# Patient Record
Sex: Male | Born: 1960
Health system: Southern US, Community
[De-identification: ages and names within clinical notes are randomized; demographics above are authoritative.]

## PROBLEM LIST (undated history)

## (undated) DIAGNOSIS — K219 Gastro-esophageal reflux disease without esophagitis: Secondary | ICD-10-CM

## (undated) DIAGNOSIS — T7840XA Allergy, unspecified, initial encounter: Secondary | ICD-10-CM

## (undated) DIAGNOSIS — E079 Disorder of thyroid, unspecified: Secondary | ICD-10-CM

## (undated) DIAGNOSIS — E291 Testicular hypofunction: Secondary | ICD-10-CM

## (undated) DIAGNOSIS — R0989 Other specified symptoms and signs involving the circulatory and respiratory systems: Secondary | ICD-10-CM

## (undated) DIAGNOSIS — E559 Vitamin D deficiency, unspecified: Secondary | ICD-10-CM

## (undated) DIAGNOSIS — K589 Irritable bowel syndrome without diarrhea: Secondary | ICD-10-CM

## (undated) HISTORY — DX: Gastro-esophageal reflux disease without esophagitis: K21.9

## (undated) HISTORY — DX: Other specified symptoms and signs involving the circulatory and respiratory systems: R09.89

## (undated) HISTORY — PX: NO PAST SURGERIES: SHX2092

## (undated) HISTORY — DX: Irritable bowel syndrome, unspecified: K58.9

## (undated) HISTORY — DX: Disorder of thyroid, unspecified: E07.9

## (undated) HISTORY — DX: Vitamin D deficiency, unspecified: E55.9

## (undated) HISTORY — DX: Testicular hypofunction: E29.1

## (undated) HISTORY — DX: Allergy, unspecified, initial encounter: T78.40XA

---

## 1998-10-04 ENCOUNTER — Emergency Department (HOSPITAL_COMMUNITY): Admission: EM | Admit: 1998-10-04 | Discharge: 1998-10-04 | Payer: Self-pay | Admitting: Emergency Medicine

## 1998-10-04 ENCOUNTER — Encounter: Payer: Self-pay | Admitting: Emergency Medicine

## 1999-06-15 ENCOUNTER — Emergency Department (HOSPITAL_COMMUNITY): Admission: EM | Admit: 1999-06-15 | Discharge: 1999-06-16 | Payer: Self-pay | Admitting: Emergency Medicine

## 1999-06-16 ENCOUNTER — Emergency Department (HOSPITAL_COMMUNITY): Admission: EM | Admit: 1999-06-16 | Discharge: 1999-06-16 | Payer: Self-pay | Admitting: Family Medicine

## 1999-06-16 ENCOUNTER — Encounter: Payer: Self-pay | Admitting: Family Medicine

## 1999-07-05 ENCOUNTER — Ambulatory Visit (HOSPITAL_COMMUNITY): Admission: RE | Admit: 1999-07-05 | Discharge: 1999-07-05 | Payer: Self-pay | Admitting: *Deleted

## 2002-02-07 ENCOUNTER — Encounter: Payer: Self-pay | Admitting: Internal Medicine

## 2002-02-07 ENCOUNTER — Ambulatory Visit (HOSPITAL_COMMUNITY): Admission: RE | Admit: 2002-02-07 | Discharge: 2002-02-07 | Payer: Self-pay | Admitting: Internal Medicine

## 2006-05-28 ENCOUNTER — Ambulatory Visit (HOSPITAL_COMMUNITY): Admission: RE | Admit: 2006-05-28 | Discharge: 2006-05-29 | Payer: Self-pay | Admitting: *Deleted

## 2006-07-21 ENCOUNTER — Encounter (INDEPENDENT_AMBULATORY_CARE_PROVIDER_SITE_OTHER): Payer: Self-pay | Admitting: Specialist

## 2006-07-21 ENCOUNTER — Ambulatory Visit (HOSPITAL_BASED_OUTPATIENT_CLINIC_OR_DEPARTMENT_OTHER): Admission: RE | Admit: 2006-07-21 | Discharge: 2006-07-21 | Payer: Self-pay | Admitting: *Deleted
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
## 2013-10-30 DIAGNOSIS — R0989 Other specified symptoms and signs involving the circulatory and respiratory systems: Secondary | ICD-10-CM | POA: Insufficient documentation

## 2013-10-30 DIAGNOSIS — T7840XA Allergy, unspecified, initial encounter: Secondary | ICD-10-CM | POA: Insufficient documentation

## 2013-10-30 DIAGNOSIS — E079 Disorder of thyroid, unspecified: Secondary | ICD-10-CM | POA: Insufficient documentation

## 2013-10-30 DIAGNOSIS — K219 Gastro-esophageal reflux disease without esophagitis: Secondary | ICD-10-CM | POA: Insufficient documentation

## 2013-10-30 DIAGNOSIS — K589 Irritable bowel syndrome without diarrhea: Secondary | ICD-10-CM | POA: Insufficient documentation

## 2013-10-30 DIAGNOSIS — E291 Testicular hypofunction: Secondary | ICD-10-CM | POA: Insufficient documentation

## 2013-10-30 DIAGNOSIS — E559 Vitamin D deficiency, unspecified: Secondary | ICD-10-CM | POA: Insufficient documentation

## 2013-11-02 ENCOUNTER — Encounter: Payer: Self-pay | Admitting: Internal Medicine

## 2016-03-03 DIAGNOSIS — H18613 Keratoconus, stable, bilateral: Secondary | ICD-10-CM | POA: Diagnosis not present

## 2016-03-25 DIAGNOSIS — M25511 Pain in right shoulder: Secondary | ICD-10-CM | POA: Diagnosis not present

## 2016-04-23 DIAGNOSIS — E7800 Pure hypercholesterolemia, unspecified: Secondary | ICD-10-CM | POA: Diagnosis not present

## 2016-04-23 DIAGNOSIS — E039 Hypothyroidism, unspecified: Secondary | ICD-10-CM | POA: Diagnosis not present

## 2016-04-23 DIAGNOSIS — N529 Male erectile dysfunction, unspecified: Secondary | ICD-10-CM | POA: Diagnosis not present

## 2016-04-23 DIAGNOSIS — Z0000 Encounter for general adult medical examination without abnormal findings: Secondary | ICD-10-CM | POA: Diagnosis not present

## 2016-04-23 DIAGNOSIS — N400 Enlarged prostate without lower urinary tract symptoms: Secondary | ICD-10-CM | POA: Diagnosis not present

## 2016-04-23 DIAGNOSIS — Z1211 Encounter for screening for malignant neoplasm of colon: Secondary | ICD-10-CM | POA: Diagnosis not present

## 2016-04-25 DIAGNOSIS — Z0000 Encounter for general adult medical examination without abnormal findings: Secondary | ICD-10-CM | POA: Diagnosis not present

## 2016-04-25 DIAGNOSIS — E7800 Pure hypercholesterolemia, unspecified: Secondary | ICD-10-CM | POA: Diagnosis not present

## 2016-04-25 DIAGNOSIS — E039 Hypothyroidism, unspecified: Secondary | ICD-10-CM | POA: Diagnosis not present

## 2016-05-06 DIAGNOSIS — H18613 Keratoconus, stable, bilateral: Secondary | ICD-10-CM | POA: Diagnosis not present

## 2016-05-06 DIAGNOSIS — H52213 Irregular astigmatism, bilateral: Secondary | ICD-10-CM | POA: Diagnosis not present

## 2016-05-29 DIAGNOSIS — H52213 Irregular astigmatism, bilateral: Secondary | ICD-10-CM | POA: Diagnosis not present

## 2016-06-05 DIAGNOSIS — M25511 Pain in right shoulder: Secondary | ICD-10-CM | POA: Diagnosis not present

## 2016-06-17 ENCOUNTER — Encounter: Payer: Self-pay | Admitting: Physical Therapy

## 2016-06-17 ENCOUNTER — Ambulatory Visit: Payer: BLUE CROSS/BLUE SHIELD | Attending: Sports Medicine | Admitting: Physical Therapy

## 2016-06-17 DIAGNOSIS — M25511 Pain in right shoulder: Secondary | ICD-10-CM | POA: Diagnosis not present

## 2016-06-17 DIAGNOSIS — M25611 Stiffness of right shoulder, not elsewhere classified: Secondary | ICD-10-CM | POA: Diagnosis not present

## 2016-06-17 NOTE — Therapy (Signed)
[address] [place], [place], [address] Phone: [phone]   Fax:  [phone]  Physical Therapy Evaluation  Patient Details  Name: [name] MRN: [id number] Date of Birth: 1960/11/05 Referring Provider: Delilah Shan  Encounter Date: 06/17/2016      PT End of Session - 06/17/16 1009    Visit Number 1   Date for PT Re-Evaluation 08/17/16   PT Start Time 0754   PT Stop Time 0845   PT Time Calculation (min) 51 min   Activity Tolerance Patient tolerated treatment well   Behavior During Therapy Operating Room Services for tasks assessed/performed      Past Medical History:  Diagnosis Date  . Allergy   . GERD (gastroesophageal reflux disease)   . IBS (irritable bowel syndrome)   . Labile hypertension   . Other testicular hypofunction   . Thyroid disease   . Vitamin D deficiency     Past Surgical History:  Procedure Laterality Date  . NO PAST SURGERIES      There were no vitals filed for this visit.       Subjective Assessment - 06/17/16 0756    Subjective Patient reports right shoulder pain for about 10 months, he thinks he may have strained the shoulder working out.  Patient reports a recent injection in the shoulder that really helped, reports 80% better since the injection   Limitations Lifting   Patient Stated Goals have less pain and better motions   Currently in Pain? Yes   Pain Score 1    Pain Location Shoulder   Pain Orientation Right;Lateral   Pain Descriptors / Indicators Aching   Pain Type Chronic pain   Pain Onset More than a month ago   Pain Frequency Constant   Aggravating Factors  lifting , using arm away from the body, pain up to 7/10   Pain Relieving Factors the cortisone injection helped, nothing otherwise helped   Effect of Pain on Daily Activities difficulty liftingh            [facility] PT Assessment - 06/17/16 0001      Assessment   Medical Diagnosis right shoulder pain   Referring Provider Kendall   Onset Date/Surgical Date 05/18/16   Hand Dominance Right   Prior Therapy no     Precautions   Precautions None     Balance Screen   Has the patient fallen in the past 6 months No   Has the patient had a decrease in activity level because of a fear of falling?  No   Is the patient reluctant to leave their home because of a fear of falling?  No     Home Environment   Additional Comments some ball throwing and does yardwork     Prior Function   Level of Independence Independent   Vocation Full time employment   Equities trader, some lifting and reaching   Leisure works out a few times a week     Posture/Postural Control   Posture Comments fwd head, and rounded shoulders     ROM / Strength   AROM / PROM / Strength AROM;Strength     AROM   AROM Assessment Site Shoulder   Right/Left Shoulder Right   Right Shoulder Flexion 135 Degrees   Right Shoulder ABduction 130 Degrees   Right Shoulder Internal Rotation 55 Degrees   Right Shoulder External Rotation 75 Degrees     Strength   Overall Strength Comments  right shoulder strength was 4+/5 for all motions except abduction was 4-/5 due to pain     Flexibility   Soft Tissue Assessment /Muscle Length --  very tight pectorals     Special Tests    Special Tests --  + impingement                           PT Education - 06/17/16 0847    Education provided Yes   Education Details issued HEP for shoulder scapular and RC stability, went over pectoral stretches and posture   Person(s) Educated Patient   Methods Explanation;Demonstration;Handout   Comprehension Verbalized understanding;Returned demonstration             PT Long Term Goals - 06/17/16 1013      PT LONG TERM GOAL #1   Title understand proper posture and body mechanics   Time 4   Period Weeks   Status Achieved     PT LONG TERM GOAL #2   Title independent with HEP   Time 4   Period  Weeks   Status Partially Met               Plan - 06/17/16 1009    Clinical Impression Statement Patient with right shoulder pain for aobut 10 months, possibly after doing a workout that was heavier than his normal, he does have poor posture, rounded shoulders, difficulty correcting.  ROM is limited but seems to be tightness.  Has some weakness in the scapular area, positive impingment   Rehab Potential Good   PT Frequency 1x / week   PT Duration 8 weeks   PT Treatment/Interventions ADLs/Self Care Home Management;Cryotherapy;Electrical Stimulation;Iontophoresis 42m/ml Dexamethasone;Moist Heat;Ultrasound;Therapeutic activities;Therapeutic exercise;Patient/family education;Manual techniques   PT Next Visit Plan Patient is going to try on his own as he feels much better after a cortisone injection about two weeks ago   Consulted and Agree with Plan of Care Patient      Patient will benefit from skilled therapeutic intervention in order to improve the following deficits and impairments:  Decreased range of motion, Decreased strength, Increased muscle spasms, Impaired flexibility, Postural dysfunction, Improper body mechanics, Pain  Visit Diagnosis: Acute pain of right shoulder - Plan: PT plan of care cert/re-cert  Stiffness of right shoulder, not elsewhere classified - Plan: PT plan of care cert/re-cert     Problem List Patient Active Problem List   Diagnosis Date Noted  . Labile hypertension   . Vitamin D deficiency   . GERD (gastroesophageal reflux disease)   . Thyroid disease   . IBS (irritable bowel syndrome)   . Other testicular hypofunction   . Allergy     ASumner Boast, PT 06/17/2016, 10:15 AM  [address] [address]Phone: [phone]  Fax:  [phone] Name: Marvin ELTINGMRN: [id number] of Birth: 904-12-1960

## 2016-07-17 DIAGNOSIS — M25511 Pain in right shoulder: Secondary | ICD-10-CM | POA: Diagnosis not present

## 2016-08-07 DIAGNOSIS — S0501XA Injury of conjunctiva and corneal abrasion without foreign body, right eye, initial encounter: Secondary | ICD-10-CM | POA: Diagnosis not present

## 2016-08-07 DIAGNOSIS — H18613 Keratoconus, stable, bilateral: Secondary | ICD-10-CM | POA: Diagnosis not present

## 2017-02-05 DIAGNOSIS — S0501XA Injury of conjunctiva and corneal abrasion without foreign body, right eye, initial encounter: Secondary | ICD-10-CM | POA: Diagnosis not present

## 2017-02-05 DIAGNOSIS — H18613 Keratoconus, stable, bilateral: Secondary | ICD-10-CM | POA: Diagnosis not present

## 2017-02-05 DIAGNOSIS — H52213 Irregular astigmatism, bilateral: Secondary | ICD-10-CM | POA: Diagnosis not present

## 2017-02-06 DIAGNOSIS — Z111 Encounter for screening for respiratory tuberculosis: Secondary | ICD-10-CM | POA: Diagnosis not present

## 2017-04-13 DIAGNOSIS — Z62811 Personal history of psychological abuse in childhood: Secondary | ICD-10-CM | POA: Diagnosis not present

## 2017-04-13 DIAGNOSIS — F4323 Adjustment disorder with mixed anxiety and depressed mood: Secondary | ICD-10-CM | POA: Diagnosis not present

## 2017-04-13 DIAGNOSIS — Z569 Unspecified problems related to employment: Secondary | ICD-10-CM | POA: Diagnosis not present

## 2017-04-13 DIAGNOSIS — Z630 Problems in relationship with spouse or partner: Secondary | ICD-10-CM | POA: Diagnosis not present

## 2017-04-17 DIAGNOSIS — A09 Infectious gastroenteritis and colitis, unspecified: Secondary | ICD-10-CM | POA: Diagnosis not present

## 2017-04-23 DIAGNOSIS — F4323 Adjustment disorder with mixed anxiety and depressed mood: Secondary | ICD-10-CM | POA: Diagnosis not present

## 2017-04-23 DIAGNOSIS — Z630 Problems in relationship with spouse or partner: Secondary | ICD-10-CM | POA: Diagnosis not present

## 2017-05-07 DIAGNOSIS — E039 Hypothyroidism, unspecified: Secondary | ICD-10-CM | POA: Diagnosis not present

## 2017-05-07 DIAGNOSIS — N529 Male erectile dysfunction, unspecified: Secondary | ICD-10-CM | POA: Diagnosis not present

## 2017-05-07 DIAGNOSIS — Z0000 Encounter for general adult medical examination without abnormal findings: Secondary | ICD-10-CM | POA: Diagnosis not present

## 2017-05-07 DIAGNOSIS — N400 Enlarged prostate without lower urinary tract symptoms: Secondary | ICD-10-CM | POA: Diagnosis not present

## 2017-05-07 DIAGNOSIS — E7800 Pure hypercholesterolemia, unspecified: Secondary | ICD-10-CM | POA: Diagnosis not present

## 2017-05-19 DIAGNOSIS — Z630 Problems in relationship with spouse or partner: Secondary | ICD-10-CM | POA: Diagnosis not present

## 2017-05-19 DIAGNOSIS — Z62811 Personal history of psychological abuse in childhood: Secondary | ICD-10-CM | POA: Diagnosis not present

## 2017-05-19 DIAGNOSIS — F4323 Adjustment disorder with mixed anxiety and depressed mood: Secondary | ICD-10-CM | POA: Diagnosis not present

## 2017-05-24 DIAGNOSIS — N390 Urinary tract infection, site not specified: Secondary | ICD-10-CM | POA: Diagnosis not present

## 2017-06-10 DIAGNOSIS — N390 Urinary tract infection, site not specified: Secondary | ICD-10-CM | POA: Diagnosis not present

## 2017-06-30 DIAGNOSIS — N390 Urinary tract infection, site not specified: Secondary | ICD-10-CM | POA: Diagnosis not present

## 2017-07-23 DIAGNOSIS — N411 Chronic prostatitis: Secondary | ICD-10-CM | POA: Diagnosis not present

## 2017-09-15 DIAGNOSIS — N411 Chronic prostatitis: Secondary | ICD-10-CM | POA: Diagnosis not present

## 2017-09-24 DIAGNOSIS — N411 Chronic prostatitis: Secondary | ICD-10-CM | POA: Diagnosis not present

## 2017-11-03 DIAGNOSIS — N411 Chronic prostatitis: Secondary | ICD-10-CM | POA: Diagnosis not present

## 2017-11-16 DIAGNOSIS — D124 Benign neoplasm of descending colon: Secondary | ICD-10-CM | POA: Diagnosis not present

## 2017-11-16 DIAGNOSIS — Z1211 Encounter for screening for malignant neoplasm of colon: Secondary | ICD-10-CM | POA: Diagnosis not present

## 2017-11-16 DIAGNOSIS — D122 Benign neoplasm of ascending colon: Secondary | ICD-10-CM | POA: Diagnosis not present

## 2018-02-04 DIAGNOSIS — H18613 Keratoconus, stable, bilateral: Secondary | ICD-10-CM | POA: Diagnosis not present

## 2018-02-13 DIAGNOSIS — S30860A Insect bite (nonvenomous) of lower back and pelvis, initial encounter: Secondary | ICD-10-CM | POA: Diagnosis not present

## 2018-02-13 DIAGNOSIS — M791 Myalgia, unspecified site: Secondary | ICD-10-CM | POA: Diagnosis not present

## 2018-02-17 DIAGNOSIS — M791 Myalgia, unspecified site: Secondary | ICD-10-CM | POA: Diagnosis not present

## 2018-03-19 DIAGNOSIS — Z111 Encounter for screening for respiratory tuberculosis: Secondary | ICD-10-CM | POA: Diagnosis not present

## 2018-05-27 DIAGNOSIS — E039 Hypothyroidism, unspecified: Secondary | ICD-10-CM | POA: Diagnosis not present

## 2018-05-27 DIAGNOSIS — E7800 Pure hypercholesterolemia, unspecified: Secondary | ICD-10-CM | POA: Diagnosis not present

## 2018-05-27 DIAGNOSIS — N529 Male erectile dysfunction, unspecified: Secondary | ICD-10-CM | POA: Diagnosis not present

## 2018-05-27 DIAGNOSIS — Z0000 Encounter for general adult medical examination without abnormal findings: Secondary | ICD-10-CM | POA: Diagnosis not present

## 2018-05-27 DIAGNOSIS — Z23 Encounter for immunization: Secondary | ICD-10-CM | POA: Diagnosis not present

## 2018-05-27 DIAGNOSIS — N400 Enlarged prostate without lower urinary tract symptoms: Secondary | ICD-10-CM | POA: Diagnosis not present

## 2018-06-26 DIAGNOSIS — M545 Low back pain: Secondary | ICD-10-CM | POA: Diagnosis not present

## 2018-07-24 DIAGNOSIS — B029 Zoster without complications: Secondary | ICD-10-CM | POA: Diagnosis not present

## 2018-08-16 DIAGNOSIS — M2550 Pain in unspecified joint: Secondary | ICD-10-CM | POA: Diagnosis not present

## 2018-08-31 DIAGNOSIS — N411 Chronic prostatitis: Secondary | ICD-10-CM | POA: Diagnosis not present

## 2018-08-31 DIAGNOSIS — R351 Nocturia: Secondary | ICD-10-CM | POA: Diagnosis not present

## 2018-08-31 DIAGNOSIS — R3911 Hesitancy of micturition: Secondary | ICD-10-CM | POA: Diagnosis not present

## 2018-08-31 DIAGNOSIS — N401 Enlarged prostate with lower urinary tract symptoms: Secondary | ICD-10-CM | POA: Diagnosis not present

## 2018-09-09 DIAGNOSIS — M62838 Other muscle spasm: Secondary | ICD-10-CM | POA: Diagnosis not present

## 2018-09-09 DIAGNOSIS — R3911 Hesitancy of micturition: Secondary | ICD-10-CM | POA: Diagnosis not present

## 2018-09-09 DIAGNOSIS — M6281 Muscle weakness (generalized): Secondary | ICD-10-CM | POA: Diagnosis not present

## 2018-09-13 DIAGNOSIS — M25511 Pain in right shoulder: Secondary | ICD-10-CM | POA: Diagnosis not present

## 2018-09-18 DIAGNOSIS — M25511 Pain in right shoulder: Secondary | ICD-10-CM | POA: Diagnosis not present

## 2018-09-22 DIAGNOSIS — M7541 Impingement syndrome of right shoulder: Secondary | ICD-10-CM | POA: Diagnosis not present

## 2018-09-22 DIAGNOSIS — M25511 Pain in right shoulder: Secondary | ICD-10-CM | POA: Diagnosis not present

## 2018-09-22 DIAGNOSIS — M19011 Primary osteoarthritis, right shoulder: Secondary | ICD-10-CM | POA: Diagnosis not present

## 2018-09-28 DIAGNOSIS — R3912 Poor urinary stream: Secondary | ICD-10-CM | POA: Diagnosis not present

## 2018-09-28 DIAGNOSIS — R3911 Hesitancy of micturition: Secondary | ICD-10-CM | POA: Diagnosis not present

## 2018-09-28 DIAGNOSIS — M62838 Other muscle spasm: Secondary | ICD-10-CM | POA: Diagnosis not present

## 2018-09-28 DIAGNOSIS — M6281 Muscle weakness (generalized): Secondary | ICD-10-CM | POA: Diagnosis not present

## 2018-11-03 DIAGNOSIS — M7541 Impingement syndrome of right shoulder: Secondary | ICD-10-CM | POA: Diagnosis not present

## 2018-11-03 DIAGNOSIS — M25511 Pain in right shoulder: Secondary | ICD-10-CM | POA: Diagnosis not present

## 2019-01-03 DIAGNOSIS — R509 Fever, unspecified: Secondary | ICD-10-CM | POA: Diagnosis not present

## 2019-01-03 DIAGNOSIS — R5383 Other fatigue: Secondary | ICD-10-CM | POA: Diagnosis not present

## 2019-01-03 DIAGNOSIS — R05 Cough: Secondary | ICD-10-CM | POA: Diagnosis not present

## 2019-01-03 DIAGNOSIS — Z03818 Encounter for observation for suspected exposure to other biological agents ruled out: Secondary | ICD-10-CM | POA: Diagnosis not present

## 2019-01-04 DIAGNOSIS — R05 Cough: Secondary | ICD-10-CM | POA: Diagnosis not present

## 2019-02-01 ENCOUNTER — Ambulatory Visit: Payer: BLUE CROSS/BLUE SHIELD | Admitting: Psychiatry

## 2019-02-08 DIAGNOSIS — M7541 Impingement syndrome of right shoulder: Secondary | ICD-10-CM | POA: Diagnosis not present

## 2019-02-08 DIAGNOSIS — M19011 Primary osteoarthritis, right shoulder: Secondary | ICD-10-CM | POA: Diagnosis not present

## 2019-02-08 DIAGNOSIS — M25511 Pain in right shoulder: Secondary | ICD-10-CM | POA: Diagnosis not present

## 2019-02-08 DIAGNOSIS — Y999 Unspecified external cause status: Secondary | ICD-10-CM | POA: Diagnosis not present

## 2019-02-08 DIAGNOSIS — S43491A Other sprain of right shoulder joint, initial encounter: Secondary | ICD-10-CM | POA: Diagnosis not present

## 2019-02-08 DIAGNOSIS — Z4889 Encounter for other specified surgical aftercare: Secondary | ICD-10-CM | POA: Diagnosis not present

## 2019-02-08 DIAGNOSIS — G8918 Other acute postprocedural pain: Secondary | ICD-10-CM | POA: Diagnosis not present

## 2019-02-08 DIAGNOSIS — I9789 Other postprocedural complications and disorders of the circulatory system, not elsewhere classified: Secondary | ICD-10-CM | POA: Diagnosis not present

## 2019-02-08 DIAGNOSIS — X58XXXA Exposure to other specified factors, initial encounter: Secondary | ICD-10-CM | POA: Diagnosis not present

## 2019-02-09 DIAGNOSIS — I9789 Other postprocedural complications and disorders of the circulatory system, not elsewhere classified: Secondary | ICD-10-CM | POA: Diagnosis not present

## 2019-02-09 DIAGNOSIS — Z4889 Encounter for other specified surgical aftercare: Secondary | ICD-10-CM | POA: Diagnosis not present

## 2019-02-09 DIAGNOSIS — M7541 Impingement syndrome of right shoulder: Secondary | ICD-10-CM | POA: Diagnosis not present

## 2019-02-10 DIAGNOSIS — Z4889 Encounter for other specified surgical aftercare: Secondary | ICD-10-CM | POA: Diagnosis not present

## 2019-02-10 DIAGNOSIS — M7541 Impingement syndrome of right shoulder: Secondary | ICD-10-CM | POA: Diagnosis not present

## 2019-02-10 DIAGNOSIS — I9789 Other postprocedural complications and disorders of the circulatory system, not elsewhere classified: Secondary | ICD-10-CM | POA: Diagnosis not present

## 2019-02-11 DIAGNOSIS — I9789 Other postprocedural complications and disorders of the circulatory system, not elsewhere classified: Secondary | ICD-10-CM | POA: Diagnosis not present

## 2019-02-11 DIAGNOSIS — M7541 Impingement syndrome of right shoulder: Secondary | ICD-10-CM | POA: Diagnosis not present

## 2019-02-11 DIAGNOSIS — Z4889 Encounter for other specified surgical aftercare: Secondary | ICD-10-CM | POA: Diagnosis not present

## 2019-02-12 DIAGNOSIS — Z4889 Encounter for other specified surgical aftercare: Secondary | ICD-10-CM | POA: Diagnosis not present

## 2019-02-12 DIAGNOSIS — I9789 Other postprocedural complications and disorders of the circulatory system, not elsewhere classified: Secondary | ICD-10-CM | POA: Diagnosis not present

## 2019-02-12 DIAGNOSIS — M7541 Impingement syndrome of right shoulder: Secondary | ICD-10-CM | POA: Diagnosis not present

## 2019-02-13 DIAGNOSIS — M7541 Impingement syndrome of right shoulder: Secondary | ICD-10-CM | POA: Diagnosis not present

## 2019-02-13 DIAGNOSIS — Z4889 Encounter for other specified surgical aftercare: Secondary | ICD-10-CM | POA: Diagnosis not present

## 2019-02-13 DIAGNOSIS — I9789 Other postprocedural complications and disorders of the circulatory system, not elsewhere classified: Secondary | ICD-10-CM | POA: Diagnosis not present

## 2019-02-14 DIAGNOSIS — Z4889 Encounter for other specified surgical aftercare: Secondary | ICD-10-CM | POA: Diagnosis not present

## 2019-02-14 DIAGNOSIS — I9789 Other postprocedural complications and disorders of the circulatory system, not elsewhere classified: Secondary | ICD-10-CM | POA: Diagnosis not present

## 2019-02-14 DIAGNOSIS — M7541 Impingement syndrome of right shoulder: Secondary | ICD-10-CM | POA: Diagnosis not present

## 2019-02-15 ENCOUNTER — Ambulatory Visit: Payer: BLUE CROSS/BLUE SHIELD | Admitting: Psychiatry

## 2019-02-15 DIAGNOSIS — Z4889 Encounter for other specified surgical aftercare: Secondary | ICD-10-CM | POA: Diagnosis not present

## 2019-02-15 DIAGNOSIS — I9789 Other postprocedural complications and disorders of the circulatory system, not elsewhere classified: Secondary | ICD-10-CM | POA: Diagnosis not present

## 2019-02-15 DIAGNOSIS — M7541 Impingement syndrome of right shoulder: Secondary | ICD-10-CM | POA: Diagnosis not present

## 2019-02-16 DIAGNOSIS — Z4889 Encounter for other specified surgical aftercare: Secondary | ICD-10-CM | POA: Diagnosis not present

## 2019-02-16 DIAGNOSIS — I9789 Other postprocedural complications and disorders of the circulatory system, not elsewhere classified: Secondary | ICD-10-CM | POA: Diagnosis not present

## 2019-02-16 DIAGNOSIS — M7541 Impingement syndrome of right shoulder: Secondary | ICD-10-CM | POA: Diagnosis not present

## 2019-02-16 DIAGNOSIS — M25511 Pain in right shoulder: Secondary | ICD-10-CM | POA: Diagnosis not present

## 2019-02-17 DIAGNOSIS — M7541 Impingement syndrome of right shoulder: Secondary | ICD-10-CM | POA: Diagnosis not present

## 2019-02-17 DIAGNOSIS — I9789 Other postprocedural complications and disorders of the circulatory system, not elsewhere classified: Secondary | ICD-10-CM | POA: Diagnosis not present

## 2019-02-17 DIAGNOSIS — Z4889 Encounter for other specified surgical aftercare: Secondary | ICD-10-CM | POA: Diagnosis not present

## 2019-02-18 ENCOUNTER — Other Ambulatory Visit: Payer: Self-pay

## 2019-02-18 ENCOUNTER — Encounter: Payer: Self-pay | Admitting: Psychiatry

## 2019-02-18 ENCOUNTER — Encounter

## 2019-02-18 ENCOUNTER — Ambulatory Visit (INDEPENDENT_AMBULATORY_CARE_PROVIDER_SITE_OTHER): Payer: BC Managed Care – PPO | Admitting: Psychiatry

## 2019-02-18 DIAGNOSIS — Z4889 Encounter for other specified surgical aftercare: Secondary | ICD-10-CM | POA: Diagnosis not present

## 2019-02-18 DIAGNOSIS — F4321 Adjustment disorder with depressed mood: Secondary | ICD-10-CM | POA: Diagnosis not present

## 2019-02-18 DIAGNOSIS — M7541 Impingement syndrome of right shoulder: Secondary | ICD-10-CM | POA: Diagnosis not present

## 2019-02-18 DIAGNOSIS — I9789 Other postprocedural complications and disorders of the circulatory system, not elsewhere classified: Secondary | ICD-10-CM | POA: Diagnosis not present
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
## 2019-02-19 DIAGNOSIS — Z4889 Encounter for other specified surgical aftercare: Secondary | ICD-10-CM | POA: Diagnosis not present

## 2019-02-19 DIAGNOSIS — M25511 Pain in right shoulder: Secondary | ICD-10-CM | POA: Diagnosis not present

## 2019-02-19 DIAGNOSIS — I9789 Other postprocedural complications and disorders of the circulatory system, not elsewhere classified: Secondary | ICD-10-CM | POA: Diagnosis not present

## 2019-02-19 DIAGNOSIS — M7541 Impingement syndrome of right shoulder: Secondary | ICD-10-CM | POA: Diagnosis not present

## 2019-02-20 DIAGNOSIS — I9789 Other postprocedural complications and disorders of the circulatory system, not elsewhere classified: Secondary | ICD-10-CM | POA: Diagnosis not present

## 2019-02-20 DIAGNOSIS — M7541 Impingement syndrome of right shoulder: Secondary | ICD-10-CM | POA: Diagnosis not present

## 2019-02-20 DIAGNOSIS — Z4889 Encounter for other specified surgical aftercare: Secondary | ICD-10-CM | POA: Diagnosis not present

## 2019-02-21 DIAGNOSIS — I9789 Other postprocedural complications and disorders of the circulatory system, not elsewhere classified: Secondary | ICD-10-CM | POA: Diagnosis not present

## 2019-02-21 DIAGNOSIS — M25511 Pain in right shoulder: Secondary | ICD-10-CM | POA: Diagnosis not present

## 2019-02-21 DIAGNOSIS — M7541 Impingement syndrome of right shoulder: Secondary | ICD-10-CM | POA: Diagnosis not present

## 2019-02-21 DIAGNOSIS — Z4889 Encounter for other specified surgical aftercare: Secondary | ICD-10-CM | POA: Diagnosis not present

## 2019-02-22 ENCOUNTER — Ambulatory Visit: Payer: BC Managed Care – PPO | Admitting: Psychiatry

## 2019-02-22 DIAGNOSIS — M7541 Impingement syndrome of right shoulder: Secondary | ICD-10-CM | POA: Diagnosis not present

## 2019-02-22 DIAGNOSIS — Z4889 Encounter for other specified surgical aftercare: Secondary | ICD-10-CM | POA: Diagnosis not present

## 2019-02-22 DIAGNOSIS — I9789 Other postprocedural complications and disorders of the circulatory system, not elsewhere classified: Secondary | ICD-10-CM | POA: Diagnosis not present

## 2019-02-23 DIAGNOSIS — I9789 Other postprocedural complications and disorders of the circulatory system, not elsewhere classified: Secondary | ICD-10-CM | POA: Diagnosis not present

## 2019-02-23 DIAGNOSIS — Z4889 Encounter for other specified surgical aftercare: Secondary | ICD-10-CM | POA: Diagnosis not present

## 2019-02-23 DIAGNOSIS — M7541 Impingement syndrome of right shoulder: Secondary | ICD-10-CM | POA: Diagnosis not present

## 2019-02-24 DIAGNOSIS — I9789 Other postprocedural complications and disorders of the circulatory system, not elsewhere classified: Secondary | ICD-10-CM | POA: Diagnosis not present

## 2019-02-24 DIAGNOSIS — Z4889 Encounter for other specified surgical aftercare: Secondary | ICD-10-CM | POA: Diagnosis not present

## 2019-02-24 DIAGNOSIS — M7541 Impingement syndrome of right shoulder: Secondary | ICD-10-CM | POA: Diagnosis not present

## 2019-02-25 DIAGNOSIS — M7541 Impingement syndrome of right shoulder: Secondary | ICD-10-CM | POA: Diagnosis not present

## 2019-02-25 DIAGNOSIS — M25511 Pain in right shoulder: Secondary | ICD-10-CM | POA: Diagnosis not present

## 2019-02-25 DIAGNOSIS — Z4889 Encounter for other specified surgical aftercare: Secondary | ICD-10-CM | POA: Diagnosis not present

## 2019-02-25 DIAGNOSIS — I9789 Other postprocedural complications and disorders of the circulatory system, not elsewhere classified: Secondary | ICD-10-CM | POA: Diagnosis not present

## 2019-02-26 DIAGNOSIS — I9789 Other postprocedural complications and disorders of the circulatory system, not elsewhere classified: Secondary | ICD-10-CM | POA: Diagnosis not present

## 2019-02-26 DIAGNOSIS — M7541 Impingement syndrome of right shoulder: Secondary | ICD-10-CM | POA: Diagnosis not present

## 2019-02-26 DIAGNOSIS — Z4889 Encounter for other specified surgical aftercare: Secondary | ICD-10-CM | POA: Diagnosis not present

## 2019-02-27 DIAGNOSIS — M7541 Impingement syndrome of right shoulder: Secondary | ICD-10-CM | POA: Diagnosis not present

## 2019-02-27 DIAGNOSIS — I9789 Other postprocedural complications and disorders of the circulatory system, not elsewhere classified: Secondary | ICD-10-CM | POA: Diagnosis not present

## 2019-02-27 DIAGNOSIS — Z4889 Encounter for other specified surgical aftercare: Secondary | ICD-10-CM | POA: Diagnosis not present

## 2019-02-28 DIAGNOSIS — M7541 Impingement syndrome of right shoulder: Secondary | ICD-10-CM | POA: Diagnosis not present

## 2019-02-28 DIAGNOSIS — Z4889 Encounter for other specified surgical aftercare: Secondary | ICD-10-CM | POA: Diagnosis not present

## 2019-02-28 DIAGNOSIS — I9789 Other postprocedural complications and disorders of the circulatory system, not elsewhere classified: Secondary | ICD-10-CM | POA: Diagnosis not present

## 2019-03-01 DIAGNOSIS — I9789 Other postprocedural complications and disorders of the circulatory system, not elsewhere classified: Secondary | ICD-10-CM | POA: Diagnosis not present

## 2019-03-01 DIAGNOSIS — Z4889 Encounter for other specified surgical aftercare: Secondary | ICD-10-CM | POA: Diagnosis not present

## 2019-03-01 DIAGNOSIS — M7541 Impingement syndrome of right shoulder: Secondary | ICD-10-CM | POA: Diagnosis not present

## 2019-03-01 DIAGNOSIS — M25511 Pain in right shoulder: Secondary | ICD-10-CM | POA: Diagnosis not present

## 2019-03-02 ENCOUNTER — Encounter: Payer: Self-pay | Admitting: Psychiatry

## 2019-03-02 ENCOUNTER — Ambulatory Visit (INDEPENDENT_AMBULATORY_CARE_PROVIDER_SITE_OTHER): Payer: BC Managed Care – PPO | Admitting: Psychiatry

## 2019-03-02 ENCOUNTER — Other Ambulatory Visit: Payer: Self-pay

## 2019-03-02 DIAGNOSIS — F4321 Adjustment disorder with depressed mood: Secondary | ICD-10-CM | POA: Diagnosis not present
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
## 2019-03-03 DIAGNOSIS — R351 Nocturia: Secondary | ICD-10-CM | POA: Diagnosis not present

## 2019-03-03 DIAGNOSIS — N401 Enlarged prostate with lower urinary tract symptoms: Secondary | ICD-10-CM | POA: Diagnosis not present

## 2019-03-03 DIAGNOSIS — R3911 Hesitancy of micturition: Secondary | ICD-10-CM | POA: Diagnosis not present

## 2019-03-03 DIAGNOSIS — R3912 Poor urinary stream: Secondary | ICD-10-CM | POA: Diagnosis not present

## 2019-03-14 ENCOUNTER — Other Ambulatory Visit: Payer: Self-pay

## 2019-03-14 ENCOUNTER — Encounter

## 2019-03-14 ENCOUNTER — Encounter: Payer: Self-pay | Admitting: Psychiatry

## 2019-03-14 ENCOUNTER — Ambulatory Visit (INDEPENDENT_AMBULATORY_CARE_PROVIDER_SITE_OTHER): Payer: BC Managed Care – PPO | Admitting: Psychiatry

## 2019-03-14 DIAGNOSIS — F4321 Adjustment disorder with depressed mood: Secondary | ICD-10-CM | POA: Diagnosis not present

## 2019-03-14 NOTE — Progress Notes (Signed)
      [facility] Counselor/Therapist Progress Note  Patient ID: [name], MRN: [id number],    Date: 03/14/2019  Time Spent: 58 minutes   Treatment Type: Individual Therapy  Reported Symptoms: sad  Mental Status Exam:  Appearance:   Casual     Behavior:  Appropriate  Motor:  Normal  Speech/Language:   Clear and Coherent  Affect:  Appropriate  Mood:  sad  Thought process:  normal  Thought content:    WNL  Sensory/Perceptual disturbances:    WNL  Orientation:  oriented to person, place, time/date and situation  Attention:  Good  Concentration:  Good  Memory:  WNL  Fund of knowledge:   Good  Insight:    Good  Judgment:   Good  Impulse Control:  Good   Risk Assessment: Danger to Self:  No Self-injurious Behavior: No Danger to Others: No Duty to Warn:no Physical Aggression / Violence:No  Access to Firearms a concern: No  Gang Involvement:No   Subjective: I met with the client face-to-face.  We both had facemasks. The client states that he has been self-medicating.  He knows that he has been drinking too much recently because of the emptiness he feels at the lack of relationship. We discussed today the fact that the clients "picker" is broken.  In choosing women he always ends up in relationships that have no reciprocity.  We discussed the fact that he has a deep amount of empathy that women are attracted to.  He is willing to give a lot on the front end and most of the women are willing to take it.  When it comes to his needs they can be at less than forthcoming.  We discussed the fact that he needed better boundaries and a longer leadtime before he would engage in a sexual relationship.  The client knows that once he does so he is in for the long haul.  The client balked at the fact of having to hold off on sex.  I explained to the client that to engage in sex too soon does not allow him enough time to see if reciprocity is developing.  The client agreed. I also advised  the client that he should be more brutal and his criteria for choosing.  The client agrees.  He can always find something good in any woman that he needs.  He just has to make sure that he does not try to rescue her.  The client concurs.  Interventions: Assertiveness/Communication, Motivational Interviewing, Solution-Oriented/Positive Psychology, [employer] Group Desensitization and Reprocessing (EMDR) and Insight-Oriented  Diagnosis:   ICD-10-CM   1. Adjustment disorder with depressed mood  F43.21     Plan: Boundaries, self-care, positive self talk.  This record has been created using [employer].  Chart creation errors have been sought, but Cambreigh Dearing not always have been located and corrected. Such creation errors do not reflect on the standard of medical care.  Rasheed Welty, [facility]

## 2019-03-15 DIAGNOSIS — M25511 Pain in right shoulder: Secondary | ICD-10-CM | POA: Diagnosis not present

## 2019-03-23 DIAGNOSIS — M25511 Pain in right shoulder: Secondary | ICD-10-CM | POA: Diagnosis not present

## 2019-03-30 DIAGNOSIS — M25511 Pain in right shoulder: Secondary | ICD-10-CM | POA: Diagnosis not present

## 2019-03-31 ENCOUNTER — Encounter: Payer: Self-pay | Admitting: Psychiatry

## 2019-03-31 ENCOUNTER — Ambulatory Visit (INDEPENDENT_AMBULATORY_CARE_PROVIDER_SITE_OTHER): Payer: BC Managed Care – PPO | Admitting: Psychiatry

## 2019-03-31 ENCOUNTER — Other Ambulatory Visit: Payer: Self-pay

## 2019-03-31 DIAGNOSIS — F4321 Adjustment disorder with depressed mood: Secondary | ICD-10-CM | POA: Diagnosis not present

## 2019-03-31 NOTE — Progress Notes (Signed)
      [facility] Counselor/Therapist Progress Note  Patient ID: [name], MRN: [id number],    Date: 03/31/2019  Time Spent: 58 minutes   Treatment Type: Individual Therapy  Reported Symptoms: sad.  Mental Status Exam:  Appearance:   Casual     Behavior:  Appropriate  Motor:  Normal  Speech/Language:   Clear and Coherent  Affect:  Appropriate  Mood:  sad  Thought process:  normal  Thought content:    WNL  Sensory/Perceptual disturbances:    WNL  Orientation:  oriented to person, place, time/date and situation  Attention:  Good  Concentration:  Good  Memory:  WNL  Fund of knowledge:   Good  Insight:    Good  Judgment:   Good  Impulse Control:  Good   Risk Assessment: Danger to Self:  No Self-injurious Behavior: No Danger to Others: No Duty to Warn:no Physical Aggression / Violence:No  Access to Firearms a concern: No  Gang Involvement:No   Subjective: I met with the client face-to-face.  We both had facemasks. The client states that he feels the core of his issues is a fear of abandonment and intimacy.  "People leave me."  He has a fear of being exposed and therefore vulnerable. We used eye-movement focusing on the sense of vulnerability and abandonment.  As the client discussed his thoughts and feelings I challenged the client to take constraints off of himself.  He holds himself back from relationship for fear of being hurt which only results in him truncating his life experience.  I asked the client to consider metaphorically, "drinking deeply from the cup of life".  This would include figuring out the things that he loves and cares about.  The client grew up in such deeply barren situations that it has always been about survival.  I asked the client to consider turning his house more into a home.  Also to find out what his creative endeavors are.  In choosing a relationship to move away from online dating and meet up groups.  Try to engage with groups doing  activities that he likes to do as well.  He agreed to do so.  His subjective units of distress went from a 7 to less than 2 at the end of the session.  His positive cognition was, "I can do this ".  Interventions: Motivational Interviewing, Solution-Oriented/Positive Psychology, [employer] Group Desensitization and Reprocessing (EMDR) and Insight-Oriented  Diagnosis:   ICD-10-CM   1. Adjustment disorder with depressed mood  F43.21     Plan: Boundaries, self-care, exercise, hobbies, social network.  This record has been created using [employer].  Chart creation errors have been sought, but [name] not always have been located and corrected. Such creation errors do not reflect on the standard of medical care.  [name], [facility]

## 2019-04-05 ENCOUNTER — Encounter: Payer: Self-pay | Admitting: Psychiatry

## 2019-04-05 ENCOUNTER — Other Ambulatory Visit: Payer: Self-pay

## 2019-04-05 ENCOUNTER — Ambulatory Visit (INDEPENDENT_AMBULATORY_CARE_PROVIDER_SITE_OTHER): Payer: BC Managed Care – PPO | Admitting: Psychiatry

## 2019-04-05 DIAGNOSIS — F4321 Adjustment disorder with depressed mood: Secondary | ICD-10-CM

## 2019-04-05 NOTE — Progress Notes (Signed)
      [facility] Counselor/Therapist Progress Note  Patient ID: [name], MRN: [id number],    Date: 04/05/2019  Time Spent: 56 minutes   Treatment Type: Individual Therapy  Reported Symptoms: irritable, frustrated.  Mental Status Exam:  Appearance:   Casual     Behavior:  Appropriate  Motor:  Normal  Speech/Language:   Clear and Coherent  Affect:  Appropriate  Mood:  irritable  Thought process:  normal  Thought content:    WNL  Sensory/Perceptual disturbances:    WNL  Orientation:  oriented to person, place, time/date and situation  Attention:  Good  Concentration:  Good  Memory:  WNL  Fund of knowledge:   Good  Insight:    Good  Judgment:   Good  Impulse Control:  Good   Risk Assessment: Danger to Self:  No Self-injurious Behavior: No Danger to Others: No Duty to Warn:no Physical Aggression / Violence:No  Access to Firearms a concern: No  Gang Involvement:No   Subjective: I met with the client face-to-face.  We both had facemasks. The client states that he decided not to pursue a relationship with a woman in his meet up group.  He finds her very engaging and attractive.  He has got on the other hand tells him it is not a good idea.  The client has a strong intuitive sense about things that 99% of the time is right.  I asked the client to trust his judgment versus pursuing something that seems to be available but maybe not good for him.  The client agrees. The client is very extroverted and has engaged his immediate group well.  He is recently divorced and has not been in a situation where women have paid so much attention to him.  He finds it disorienting to be in such demand and have sexual liaisons easily available. I used eye-movement with the client to process.  His confusion about these relationships.  He agrees that if he sleeps with someone he becomes very attached and it is not wise to pursue that if the relationship has no future.  It would cause him too  much distress.  Interventions: Motivational Interviewing, Solution-Oriented/Positive Psychology, [employer] Group Desensitization and Reprocessing (EMDR) and Insight-Oriented  Diagnosis:   ICD-10-CM   1. Adjustment disorder with depressed mood  F43.21     Plan: Positive self talk, self awareness, boundaries, assertiveness.  This record has been created using [employer].  Chart creation errors have been sought, but [name] not always have been located and corrected. Such creation errors do not reflect on the standard of medical care.  [name], [facility]

## 2019-04-06 DIAGNOSIS — M25511 Pain in right shoulder: Secondary | ICD-10-CM | POA: Diagnosis not present

## 2019-04-11 ENCOUNTER — Ambulatory Visit: Payer: BC Managed Care – PPO | Admitting: Psychiatry

## 2019-04-12 DIAGNOSIS — H18613 Keratoconus, stable, bilateral: Secondary | ICD-10-CM | POA: Diagnosis not present

## 2019-04-13 DIAGNOSIS — L237 Allergic contact dermatitis due to plants, except food: Secondary | ICD-10-CM | POA: Diagnosis not present

## 2019-04-18 ENCOUNTER — Other Ambulatory Visit: Payer: Self-pay

## 2019-04-18 ENCOUNTER — Encounter: Payer: Self-pay | Admitting: Psychiatry

## 2019-04-18 ENCOUNTER — Ambulatory Visit (INDEPENDENT_AMBULATORY_CARE_PROVIDER_SITE_OTHER): Payer: BC Managed Care – PPO | Admitting: Psychiatry

## 2019-04-18 DIAGNOSIS — F4321 Adjustment disorder with depressed mood: Secondary | ICD-10-CM | POA: Diagnosis not present

## 2019-04-18 NOTE — Progress Notes (Signed)
      [facility] Counselor/Therapist Progress Note  Patient ID: [name], MRN: [id number],    Date: 04/18/2019  Time Spent: 56 minutes   Treatment Type: Individual Therapy  Reported Symptoms: irritable  Mental Status Exam:  Appearance:   Casual     Behavior:  Appropriate  Motor:  Normal  Speech/Language:   Clear and Coherent  Affect:  Appropriate  Mood:  irritable  Thought process:  normal  Thought content:    WNL  Sensory/Perceptual disturbances:    WNL  Orientation:  oriented to person, place, time/date and situation  Attention:  Good  Concentration:  Good  Memory:  WNL  Fund of knowledge:   Good  Insight:    Good  Judgment:   Good  Impulse Control:  Good   Risk Assessment: Danger to Self:  No Self-injurious Behavior: No Danger to Others: No Duty to Warn:no Physical Aggression / Violence:No  Access to Firearms a concern: No  Gang Involvement:No   Subjective: The client reports that he has made very good progress.  He has been realizing how much his empathy can get him into trouble if he does not pay attention to it.  He is being much more conscious about his choices and thoughtful about how he approaches women.   Recently a group of his friends went to the mountains without him.  He stated he felt hurt and abandoned by them.  As he reflected on it over the weekend he realized that he was being childish.  This was a big step forward for the client.  He also saw how he was much more socially intelligent when he had given himself credit for.  He realizes that he needs to cultivate a more balanced life.  This would include outside activity as well as a larger social network.  He would work towards a regular sleep-wake schedule and possibly start taking omega-3 fatty acids.  We also discussed him cultivating new hobbies and activities. The client feels that he has resolved his issues around abandonment that resulted from his recent break-up.  He and I both agreed to  begin services by mutual consent.  The client will follow-up as needed.  Interventions: Assertiveness/Communication, Motivational Interviewing, Solution-Oriented/Positive Psychology, [employer] Group Desensitization and Reprocessing (EMDR) and Insight-Oriented  Diagnosis:   ICD-10-CM   1. Adjustment disorder with depressed mood  F43.21     Plan: Services ended by mutual consent.  Follow-up as needed.  This record has been created using [employer].  Chart creation errors have been sought, but Miho Monda not always have been located and corrected. Such creation errors do not reflect on the standard of medical care.  Jawann Urbani, [facility]

## 2019-04-28 DIAGNOSIS — N5201 Erectile dysfunction due to arterial insufficiency: Secondary | ICD-10-CM | POA: Diagnosis not present

## 2019-04-28 DIAGNOSIS — N401 Enlarged prostate with lower urinary tract symptoms: Secondary | ICD-10-CM | POA: Diagnosis not present

## 2019-04-28 DIAGNOSIS — R3912 Poor urinary stream: Secondary | ICD-10-CM | POA: Diagnosis not present

## 2019-05-06 DIAGNOSIS — K9041 Non-celiac gluten sensitivity: Secondary | ICD-10-CM | POA: Diagnosis not present

## 2019-05-06 DIAGNOSIS — E039 Hypothyroidism, unspecified: Secondary | ICD-10-CM | POA: Diagnosis not present

## 2019-05-06 DIAGNOSIS — R5383 Other fatigue: Secondary | ICD-10-CM | POA: Diagnosis not present

## 2019-05-06 DIAGNOSIS — E7800 Pure hypercholesterolemia, unspecified: Secondary | ICD-10-CM | POA: Diagnosis not present

## 2019-06-10 DIAGNOSIS — H18613 Keratoconus, stable, bilateral: Secondary | ICD-10-CM | POA: Diagnosis not present

## 2019-06-16 DIAGNOSIS — B349 Viral infection, unspecified: Secondary | ICD-10-CM | POA: Diagnosis not present

## 2019-06-16 DIAGNOSIS — Z20828 Contact with and (suspected) exposure to other viral communicable diseases: Secondary | ICD-10-CM | POA: Diagnosis not present

## 2019-06-17 DIAGNOSIS — R05 Cough: Secondary | ICD-10-CM | POA: Diagnosis not present

## 2019-07-26 DIAGNOSIS — Z111 Encounter for screening for respiratory tuberculosis: Secondary | ICD-10-CM | POA: Diagnosis not present

## 2019-07-26 DIAGNOSIS — E7800 Pure hypercholesterolemia, unspecified: Secondary | ICD-10-CM | POA: Diagnosis not present

## 2019-07-26 DIAGNOSIS — Z0000 Encounter for general adult medical examination without abnormal findings: Secondary | ICD-10-CM | POA: Diagnosis not present

## 2019-07-26 DIAGNOSIS — Z125 Encounter for screening for malignant neoplasm of prostate: Secondary | ICD-10-CM | POA: Diagnosis not present

## 2019-07-26 DIAGNOSIS — N400 Enlarged prostate without lower urinary tract symptoms: Secondary | ICD-10-CM | POA: Diagnosis not present

## 2019-07-26 DIAGNOSIS — Z1159 Encounter for screening for other viral diseases: Secondary | ICD-10-CM | POA: Diagnosis not present

## 2019-07-26 DIAGNOSIS — E039 Hypothyroidism, unspecified: Secondary | ICD-10-CM | POA: Diagnosis not present

## 2019-07-26 DIAGNOSIS — N529 Male erectile dysfunction, unspecified: Secondary | ICD-10-CM | POA: Diagnosis not present

## 2019-07-26 DIAGNOSIS — Z23 Encounter for immunization: Secondary | ICD-10-CM | POA: Diagnosis not present

## 2019-09-22 DIAGNOSIS — E7800 Pure hypercholesterolemia, unspecified: Secondary | ICD-10-CM | POA: Diagnosis not present

## 2019-10-28 DIAGNOSIS — R413 Other amnesia: Secondary | ICD-10-CM | POA: Diagnosis not present

## 2019-10-28 DIAGNOSIS — J019 Acute sinusitis, unspecified: Secondary | ICD-10-CM | POA: Diagnosis not present

## 2019-11-08 DIAGNOSIS — H6982 Other specified disorders of Eustachian tube, left ear: Secondary | ICD-10-CM | POA: Diagnosis not present

## 2019-11-08 DIAGNOSIS — J329 Chronic sinusitis, unspecified: Secondary | ICD-10-CM | POA: Diagnosis not present

## 2019-12-06 DIAGNOSIS — E7800 Pure hypercholesterolemia, unspecified: Secondary | ICD-10-CM | POA: Diagnosis not present

## 2019-12-28 DIAGNOSIS — S30861A Insect bite (nonvenomous) of abdominal wall, initial encounter: Secondary | ICD-10-CM | POA: Diagnosis not present

## 2020-01-21 DIAGNOSIS — R5383 Other fatigue: Secondary | ICD-10-CM | POA: Diagnosis not present

## 2020-04-09 DIAGNOSIS — J22 Unspecified acute lower respiratory infection: Secondary | ICD-10-CM | POA: Diagnosis not present

## 2020-04-09 DIAGNOSIS — J019 Acute sinusitis, unspecified: Secondary | ICD-10-CM | POA: Diagnosis not present

## 2020-04-30 DIAGNOSIS — H18613 Keratoconus, stable, bilateral: Secondary | ICD-10-CM | POA: Diagnosis not present

## 2020-07-31 DIAGNOSIS — Z0000 Encounter for general adult medical examination without abnormal findings: Secondary | ICD-10-CM | POA: Diagnosis not present

## 2020-07-31 DIAGNOSIS — N400 Enlarged prostate without lower urinary tract symptoms: Secondary | ICD-10-CM | POA: Diagnosis not present

## 2020-07-31 DIAGNOSIS — E7800 Pure hypercholesterolemia, unspecified: Secondary | ICD-10-CM | POA: Diagnosis not present

## 2020-07-31 DIAGNOSIS — F419 Anxiety disorder, unspecified: Secondary | ICD-10-CM | POA: Diagnosis not present

## 2020-07-31 DIAGNOSIS — E039 Hypothyroidism, unspecified: Secondary | ICD-10-CM | POA: Diagnosis not present

## 2020-07-31 DIAGNOSIS — Z23 Encounter for immunization: Secondary | ICD-10-CM | POA: Diagnosis not present

## 2020-07-31 DIAGNOSIS — Z125 Encounter for screening for malignant neoplasm of prostate: Secondary | ICD-10-CM | POA: Diagnosis not present

## 2020-09-28 DIAGNOSIS — R351 Nocturia: Secondary | ICD-10-CM | POA: Diagnosis not present

## 2020-09-28 DIAGNOSIS — R3912 Poor urinary stream: Secondary | ICD-10-CM | POA: Diagnosis not present

## 2020-09-28 DIAGNOSIS — N401 Enlarged prostate with lower urinary tract symptoms: Secondary | ICD-10-CM | POA: Diagnosis not present

## 2020-09-28 DIAGNOSIS — R3916 Straining to void: Secondary | ICD-10-CM | POA: Diagnosis not present

## 2021-02-04 DIAGNOSIS — J0110 Acute frontal sinusitis, unspecified: Secondary | ICD-10-CM | POA: Diagnosis not present

## 2021-05-06 DIAGNOSIS — H2513 Age-related nuclear cataract, bilateral: Secondary | ICD-10-CM | POA: Diagnosis not present

## 2021-05-06 DIAGNOSIS — H18613 Keratoconus, stable, bilateral: Secondary | ICD-10-CM | POA: Diagnosis not present

## 2021-07-09 DIAGNOSIS — B349 Viral infection, unspecified: Secondary | ICD-10-CM | POA: Diagnosis not present

## 2021-07-09 DIAGNOSIS — R21 Rash and other nonspecific skin eruption: Secondary | ICD-10-CM | POA: Diagnosis not present

## 2021-07-09 DIAGNOSIS — J0100 Acute maxillary sinusitis, unspecified: Secondary | ICD-10-CM | POA: Diagnosis not present

## 2021-08-26 DIAGNOSIS — E7800 Pure hypercholesterolemia, unspecified: Secondary | ICD-10-CM | POA: Diagnosis not present

## 2021-08-26 DIAGNOSIS — Z23 Encounter for immunization: Secondary | ICD-10-CM | POA: Diagnosis not present

## 2021-08-26 DIAGNOSIS — F419 Anxiety disorder, unspecified: Secondary | ICD-10-CM | POA: Diagnosis not present

## 2021-08-26 DIAGNOSIS — E039 Hypothyroidism, unspecified: Secondary | ICD-10-CM | POA: Diagnosis not present

## 2021-08-26 DIAGNOSIS — N400 Enlarged prostate without lower urinary tract symptoms: Secondary | ICD-10-CM | POA: Diagnosis not present

## 2021-10-30 DIAGNOSIS — R3912 Poor urinary stream: Secondary | ICD-10-CM | POA: Diagnosis not present

## 2021-10-30 DIAGNOSIS — N401 Enlarged prostate with lower urinary tract symptoms: Secondary | ICD-10-CM | POA: Diagnosis not present

## 2021-10-30 DIAGNOSIS — R3916 Straining to void: Secondary | ICD-10-CM | POA: Diagnosis not present

## 2021-10-30 DIAGNOSIS — R351 Nocturia: Secondary | ICD-10-CM | POA: Diagnosis not present

## 2021-11-06 DIAGNOSIS — E039 Hypothyroidism, unspecified: Secondary | ICD-10-CM | POA: Diagnosis not present

## 2021-11-06 DIAGNOSIS — E038 Other specified hypothyroidism: Secondary | ICD-10-CM | POA: Diagnosis not present

## 2021-11-06 DIAGNOSIS — F419 Anxiety disorder, unspecified: Secondary | ICD-10-CM | POA: Diagnosis not present

## 2021-11-06 DIAGNOSIS — E7800 Pure hypercholesterolemia, unspecified: Secondary | ICD-10-CM | POA: Diagnosis not present

## 2021-11-06 DIAGNOSIS — N400 Enlarged prostate without lower urinary tract symptoms: Secondary | ICD-10-CM | POA: Diagnosis not present

## 2021-11-06 DIAGNOSIS — Z0000 Encounter for general adult medical examination without abnormal findings: Secondary | ICD-10-CM | POA: Diagnosis not present

## 2021-11-07 ENCOUNTER — Other Ambulatory Visit: Payer: Self-pay | Admitting: Physician Assistant

## 2021-11-07 DIAGNOSIS — R2231 Localized swelling, mass and lump, right upper limb: Secondary | ICD-10-CM

## 2021-11-15 ENCOUNTER — Ambulatory Visit
Admission: RE | Admit: 2021-11-15 | Discharge: 2021-11-15 | Disposition: A | Discharge status: Home / Self Care | Payer: BC Managed Care – PPO | Source: Ambulatory Visit | Attending: Physician Assistant | Admitting: Physician Assistant

## 2021-11-15 DIAGNOSIS — R2231 Localized swelling, mass and lump, right upper limb: Secondary | ICD-10-CM

## 2021-12-27 DIAGNOSIS — Z23 Encounter for immunization: Secondary | ICD-10-CM | POA: Diagnosis not present

## 2022-02-12 DIAGNOSIS — J0140 Acute pansinusitis, unspecified: Secondary | ICD-10-CM | POA: Diagnosis not present

## 2022-02-24 DIAGNOSIS — J321 Chronic frontal sinusitis: Secondary | ICD-10-CM | POA: Diagnosis not present

## 2022-03-31 DIAGNOSIS — F4389 Other reactions to severe stress: Secondary | ICD-10-CM | POA: Diagnosis not present

## 2022-03-31 DIAGNOSIS — F411 Generalized anxiety disorder: Secondary | ICD-10-CM | POA: Diagnosis not present

## 2022-04-08 DIAGNOSIS — F411 Generalized anxiety disorder: Secondary | ICD-10-CM | POA: Diagnosis not present

## 2022-04-08 DIAGNOSIS — F4389 Other reactions to severe stress: Secondary | ICD-10-CM | POA: Diagnosis not present

## 2022-04-15 DIAGNOSIS — F411 Generalized anxiety disorder: Secondary | ICD-10-CM | POA: Diagnosis not present

## 2022-04-15 DIAGNOSIS — F4389 Other reactions to severe stress: Secondary | ICD-10-CM | POA: Diagnosis not present

## 2022-04-28 DIAGNOSIS — L309 Dermatitis, unspecified: Secondary | ICD-10-CM | POA: Diagnosis not present

## 2022-05-05 DIAGNOSIS — F411 Generalized anxiety disorder: Secondary | ICD-10-CM | POA: Diagnosis not present

## 2022-05-05 DIAGNOSIS — F4389 Other reactions to severe stress: Secondary | ICD-10-CM | POA: Diagnosis not present

## 2022-05-20 DIAGNOSIS — F4389 Other reactions to severe stress: Secondary | ICD-10-CM | POA: Diagnosis not present

## 2022-05-20 DIAGNOSIS — F411 Generalized anxiety disorder: Secondary | ICD-10-CM | POA: Diagnosis not present

## 2022-06-17 DIAGNOSIS — F411 Generalized anxiety disorder: Secondary | ICD-10-CM | POA: Diagnosis not present

## 2022-06-17 DIAGNOSIS — F4389 Other reactions to severe stress: Secondary | ICD-10-CM | POA: Diagnosis not present

## 2022-07-10 DIAGNOSIS — F4389 Other reactions to severe stress: Secondary | ICD-10-CM | POA: Diagnosis not present

## 2022-07-10 DIAGNOSIS — F411 Generalized anxiety disorder: Secondary | ICD-10-CM | POA: Diagnosis not present

## 2022-07-24 DIAGNOSIS — F411 Generalized anxiety disorder: Secondary | ICD-10-CM | POA: Diagnosis not present

## 2022-07-24 DIAGNOSIS — F4389 Other reactions to severe stress: Secondary | ICD-10-CM | POA: Diagnosis not present

## 2022-08-15 DIAGNOSIS — H8113 Benign paroxysmal vertigo, bilateral: Secondary | ICD-10-CM | POA: Diagnosis not present

## 2022-08-15 DIAGNOSIS — J018 Other acute sinusitis: Secondary | ICD-10-CM | POA: Diagnosis not present

## 2022-09-23 DIAGNOSIS — F411 Generalized anxiety disorder: Secondary | ICD-10-CM | POA: Diagnosis not present

## 2022-09-23 DIAGNOSIS — F4389 Other reactions to severe stress: Secondary | ICD-10-CM | POA: Diagnosis not present

## 2022-09-24 DIAGNOSIS — H43811 Vitreous degeneration, right eye: Secondary | ICD-10-CM | POA: Diagnosis not present

## 2022-10-14 DIAGNOSIS — F4389 Other reactions to severe stress: Secondary | ICD-10-CM | POA: Diagnosis not present

## 2022-10-14 DIAGNOSIS — F411 Generalized anxiety disorder: Secondary | ICD-10-CM | POA: Diagnosis not present

## 2022-11-04 DIAGNOSIS — F4389 Other reactions to severe stress: Secondary | ICD-10-CM | POA: Diagnosis not present

## 2022-11-04 DIAGNOSIS — F411 Generalized anxiety disorder: Secondary | ICD-10-CM | POA: Diagnosis not present

## 2022-11-13 DIAGNOSIS — E7800 Pure hypercholesterolemia, unspecified: Secondary | ICD-10-CM | POA: Diagnosis not present

## 2022-11-13 DIAGNOSIS — Z0000 Encounter for general adult medical examination without abnormal findings: Secondary | ICD-10-CM | POA: Diagnosis not present

## 2022-11-13 DIAGNOSIS — N400 Enlarged prostate without lower urinary tract symptoms: Secondary | ICD-10-CM | POA: Diagnosis not present

## 2022-11-13 DIAGNOSIS — E039 Hypothyroidism, unspecified: Secondary | ICD-10-CM | POA: Diagnosis not present

## 2022-12-11 DIAGNOSIS — F4389 Other reactions to severe stress: Secondary | ICD-10-CM | POA: Diagnosis not present

## 2022-12-11 DIAGNOSIS — F411 Generalized anxiety disorder: Secondary | ICD-10-CM | POA: Diagnosis not present

## 2023-01-08 DIAGNOSIS — H43811 Vitreous degeneration, right eye: Secondary | ICD-10-CM | POA: Diagnosis not present

## 2023-01-19 DIAGNOSIS — F411 Generalized anxiety disorder: Secondary | ICD-10-CM | POA: Diagnosis not present

## 2023-01-19 DIAGNOSIS — F4389 Other reactions to severe stress: Secondary | ICD-10-CM | POA: Diagnosis not present

## 2023-02-11 DIAGNOSIS — F411 Generalized anxiety disorder: Secondary | ICD-10-CM | POA: Diagnosis not present

## 2023-02-11 DIAGNOSIS — F4389 Other reactions to severe stress: Secondary | ICD-10-CM | POA: Diagnosis not present

## 2023-03-25 DIAGNOSIS — F4389 Other reactions to severe stress: Secondary | ICD-10-CM | POA: Diagnosis not present

## 2023-03-25 DIAGNOSIS — F411 Generalized anxiety disorder: Secondary | ICD-10-CM | POA: Diagnosis not present

## 2023-04-15 DIAGNOSIS — F4389 Other reactions to severe stress: Secondary | ICD-10-CM | POA: Diagnosis not present

## 2023-04-15 DIAGNOSIS — F411 Generalized anxiety disorder: Secondary | ICD-10-CM | POA: Diagnosis not present

## 2023-05-13 DIAGNOSIS — F4389 Other reactions to severe stress: Secondary | ICD-10-CM | POA: Diagnosis not present

## 2023-05-13 DIAGNOSIS — F411 Generalized anxiety disorder: Secondary | ICD-10-CM | POA: Diagnosis not present

## 2023-05-18 ENCOUNTER — Other Ambulatory Visit (HOSPITAL_COMMUNITY): Payer: Self-pay | Admitting: Physician Assistant

## 2023-05-18 DIAGNOSIS — N400 Enlarged prostate without lower urinary tract symptoms: Secondary | ICD-10-CM | POA: Diagnosis not present

## 2023-05-18 DIAGNOSIS — E7800 Pure hypercholesterolemia, unspecified: Secondary | ICD-10-CM

## 2023-05-18 DIAGNOSIS — E039 Hypothyroidism, unspecified: Secondary | ICD-10-CM | POA: Diagnosis not present

## 2023-06-01 DIAGNOSIS — J324 Chronic pansinusitis: Secondary | ICD-10-CM | POA: Diagnosis not present

## 2023-06-01 DIAGNOSIS — J343 Hypertrophy of nasal turbinates: Secondary | ICD-10-CM | POA: Diagnosis not present

## 2023-06-02 ENCOUNTER — Other Ambulatory Visit: Payer: Self-pay | Admitting: Otolaryngology

## 2023-06-02 DIAGNOSIS — J320 Chronic maxillary sinusitis: Secondary | ICD-10-CM

## 2023-06-03 ENCOUNTER — Ambulatory Visit (HOSPITAL_COMMUNITY)
Admission: RE | Admit: 2023-06-03 | Discharge: 2023-06-03 | Disposition: A | Discharge status: Home / Self Care | Payer: BLUE CROSS/BLUE SHIELD | Source: Ambulatory Visit | Attending: Physician Assistant | Admitting: Physician Assistant

## 2023-06-03 DIAGNOSIS — E7800 Pure hypercholesterolemia, unspecified: Secondary | ICD-10-CM

## 2023-06-17 DIAGNOSIS — F411 Generalized anxiety disorder: Secondary | ICD-10-CM | POA: Diagnosis not present

## 2023-06-17 DIAGNOSIS — F4389 Other reactions to severe stress: Secondary | ICD-10-CM | POA: Diagnosis not present

## 2023-06-18 ENCOUNTER — Ambulatory Visit
Admission: RE | Admit: 2023-06-18 | Discharge: 2023-06-18 | Disposition: A | Discharge status: Home / Self Care | Payer: BC Managed Care – PPO | Source: Ambulatory Visit | Attending: Otolaryngology | Admitting: Otolaryngology

## 2023-06-18 DIAGNOSIS — J320 Chronic maxillary sinusitis: Secondary | ICD-10-CM

## 2023-06-18 DIAGNOSIS — J3489 Other specified disorders of nose and nasal sinuses: Secondary | ICD-10-CM | POA: Diagnosis not present

## 2023-07-02 ENCOUNTER — Encounter (INDEPENDENT_AMBULATORY_CARE_PROVIDER_SITE_OTHER): Payer: Self-pay

## 2023-07-02 ENCOUNTER — Ambulatory Visit (INDEPENDENT_AMBULATORY_CARE_PROVIDER_SITE_OTHER): Payer: BLUE CROSS/BLUE SHIELD | Admitting: Otolaryngology

## 2023-07-02 VITALS — Ht 72.0 in | Wt 180.0 lb

## 2023-07-02 DIAGNOSIS — J343 Hypertrophy of nasal turbinates: Secondary | ICD-10-CM | POA: Diagnosis not present

## 2023-07-02 DIAGNOSIS — J324 Chronic pansinusitis: Secondary | ICD-10-CM

## 2023-07-02 DIAGNOSIS — H6983 Other specified disorders of Eustachian tube, bilateral: Secondary | ICD-10-CM

## 2023-07-06 DIAGNOSIS — H6983 Other specified disorders of Eustachian tube, bilateral: Secondary | ICD-10-CM | POA: Insufficient documentation

## 2023-07-06 DIAGNOSIS — J324 Chronic pansinusitis: Secondary | ICD-10-CM | POA: Insufficient documentation

## 2023-07-06 DIAGNOSIS — J343 Hypertrophy of nasal turbinates: Secondary | ICD-10-CM | POA: Insufficient documentation

## 2023-07-06 NOTE — Progress Notes (Signed)
Patient ID: [name], male   DOB: 23-Apr-1961, 62 y.o.   MRN: [id number]  Follow-up: Chronic nasal congestion, recurrent sinusitis, eustachian tube dysfunction  HPI: The patient is a 62 year old male who returns today for his follow-up evaluation.  He was last seen 1 month ago.  At that time, he was complaining of recurrent sinus infections, chronic nasal congestion, and clogging sensation in his ears.  He was noted to have nasal mucosal congestion and bilateral inferior turbinate hypertrophy.  He was treated with Flonase nasal spray.  He recently underwent a sinus CT scan.  The CT showed mild diffuse sinus mucosal edema.  No significant acute or chronic sinusitis was noted.  The patient returns today reporting slight improvement in his nasal congestion and eustachian tube dysfunction symptoms.  Currently he denies any facial pain, fever, or visual change.  Exam: General: Communicates without difficulty, well nourished, no acute distress. Head: Normocephalic, no evidence injury, no tenderness, facial buttresses intact without stepoff. Face/sinus: No tenderness to palpation and percussion. Facial movement is normal and symmetric. Eyes: PERRL, EOMI. No scleral icterus, conjunctivae clear. Neuro: CN II exam reveals vision grossly intact.  No nystagmus at any point of gaze. Ears: Auricles well formed without lesions.  Ear canals are intact without mass or lesion.  No erythema or edema is appreciated.  The TMs are intact without fluid. Nose: External evaluation reveals normal support and skin without lesions.  Dorsum is intact.  Anterior rhinoscopy reveals congested mucosa over anterior aspect of inferior turbinates and intact septum.  No purulence noted. Oral:  Oral cavity and oropharynx are intact, symmetric, without erythema or edema.  Mucosa is moist without lesions. Neck: Full range of motion without pain.  There is no significant lymphadenopathy.  No masses palpable.  Thyroid bed within normal limits to  palpation.  Parotid glands and submandibular glands equal bilaterally without mass.  Trachea is midline. Neuro:  CN 2-12 grossly intact.    Assessment: 1.  Chronic rhinitis with nasal mucosal congestion and bilateral inferior turbinate hypertrophy.  The severity of his nasal congestion has decreased with the daily use of Flonase nasal spray. 2.  Mild diffuse bilateral sinus mucosal edema on his CT scan.  No significant infection is noted today. 3.  Bilateral eustachian tube dysfunction.  Plan: 1.  The physical exam findings and the CT images are extensively reviewed with the patient. 2.  The patient is reassured that no acute or chronic sinusitis is noted on the CT scan or today's examination. 3.  Continue Flonase nasal spray 2 sprays each nostril daily.  The proper technique of applying the Flonase nasal spray is demonstrated to the patient. 4.  Valsalva exercise multiple times a day. 5.  The patient will return for reevaluation in 6 months, sooner if needed.

## 2023-07-15 DIAGNOSIS — F411 Generalized anxiety disorder: Secondary | ICD-10-CM | POA: Diagnosis not present

## 2023-07-15 DIAGNOSIS — F4389 Other reactions to severe stress: Secondary | ICD-10-CM | POA: Diagnosis not present

## 2023-09-23 DIAGNOSIS — F411 Generalized anxiety disorder: Secondary | ICD-10-CM | POA: Diagnosis not present

## 2023-09-23 DIAGNOSIS — F4389 Other reactions to severe stress: Secondary | ICD-10-CM | POA: Diagnosis not present

## 2023-11-19 DIAGNOSIS — N400 Enlarged prostate without lower urinary tract symptoms: Secondary | ICD-10-CM | POA: Diagnosis not present

## 2023-11-19 DIAGNOSIS — E039 Hypothyroidism, unspecified: Secondary | ICD-10-CM | POA: Diagnosis not present

## 2023-11-19 DIAGNOSIS — Z23 Encounter for immunization: Secondary | ICD-10-CM | POA: Diagnosis not present

## 2023-11-19 DIAGNOSIS — Z0000 Encounter for general adult medical examination without abnormal findings: Secondary | ICD-10-CM | POA: Diagnosis not present

## 2023-11-19 DIAGNOSIS — Z125 Encounter for screening for malignant neoplasm of prostate: Secondary | ICD-10-CM | POA: Diagnosis not present

## 2023-11-19 DIAGNOSIS — E7800 Pure hypercholesterolemia, unspecified: Secondary | ICD-10-CM | POA: Diagnosis not present

## 2023-11-19 DIAGNOSIS — I2510 Atherosclerotic heart disease of native coronary artery without angina pectoris: Secondary | ICD-10-CM | POA: Diagnosis not present

## 2023-11-20 DIAGNOSIS — R361 Hematospermia: Secondary | ICD-10-CM | POA: Diagnosis not present

## 2023-11-20 DIAGNOSIS — R351 Nocturia: Secondary | ICD-10-CM | POA: Diagnosis not present

## 2023-11-20 DIAGNOSIS — N401 Enlarged prostate with lower urinary tract symptoms: Secondary | ICD-10-CM | POA: Diagnosis not present

## 2023-11-20 DIAGNOSIS — R3912 Poor urinary stream: Secondary | ICD-10-CM | POA: Diagnosis not present

## 2023-12-11 ENCOUNTER — Ambulatory Visit (INDEPENDENT_AMBULATORY_CARE_PROVIDER_SITE_OTHER): Payer: BC Managed Care – PPO

## 2023-12-22 DIAGNOSIS — R3912 Poor urinary stream: Secondary | ICD-10-CM | POA: Diagnosis not present

## 2023-12-22 DIAGNOSIS — N401 Enlarged prostate with lower urinary tract symptoms: Secondary | ICD-10-CM | POA: Diagnosis not present

## 2023-12-30 DIAGNOSIS — Z1212 Encounter for screening for malignant neoplasm of rectum: Secondary | ICD-10-CM | POA: Diagnosis not present

## 2024-01-07 IMAGING — US US EXTREM UP *R* LTD
1 series · 8 of 8 positions shown · non-contrast
Comparison: None.

CLINICAL DATA: Mass of right forearm for 6 months. No pain. No
reported change in size.

EXAM:
ULTRASOUND RIGHT UPPER EXTREMITY LIMITED
TECHNIQUE: Ultrasound examination of the upper extremity soft tissues was
performed in the area of clinical concern.

[Series 1: us extrem up *right* ltd · 0.03mm/px · 8 acquisitions, 8 frames shown]
[im 1/8]
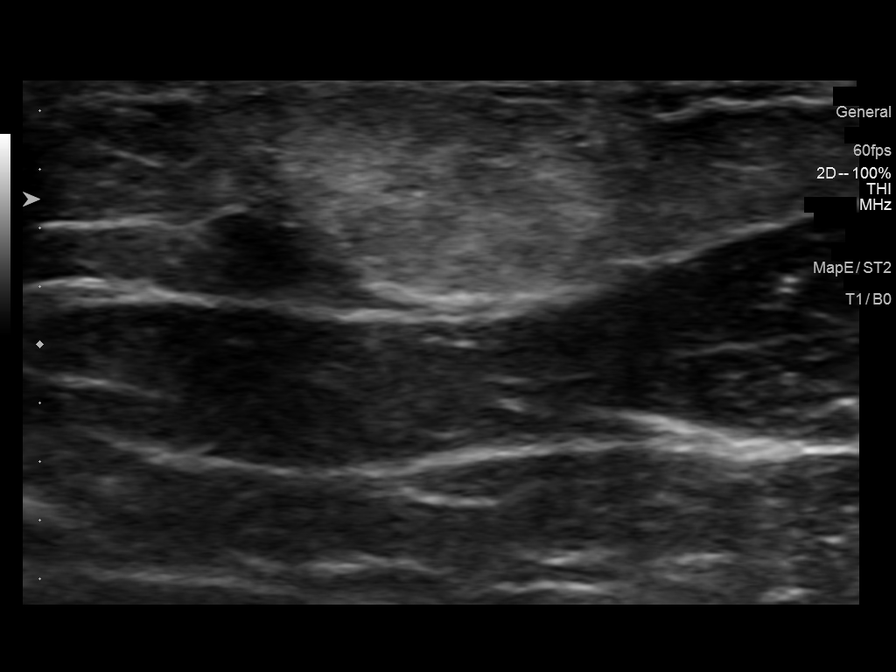
[im 2/8]
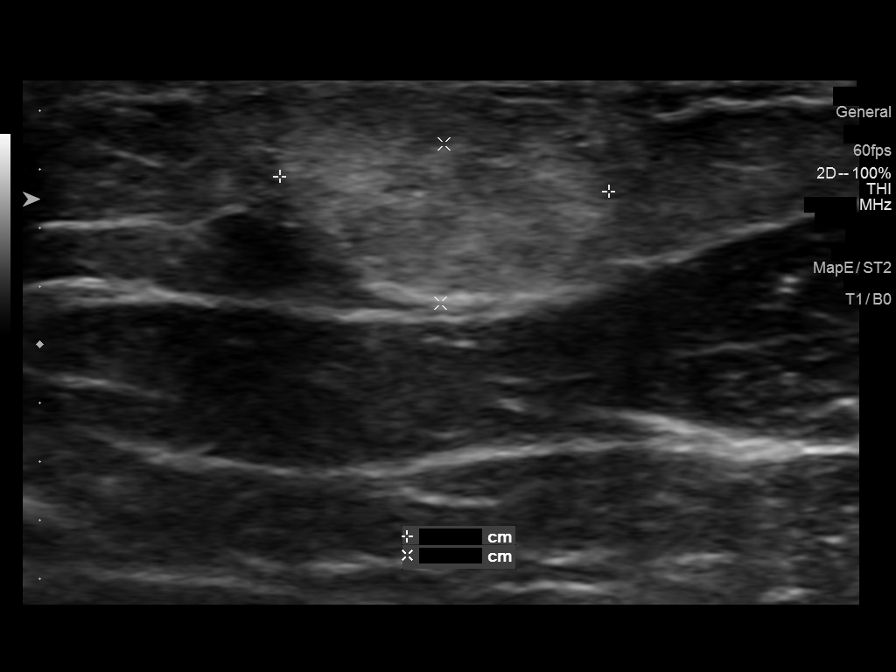
[im 3/8]
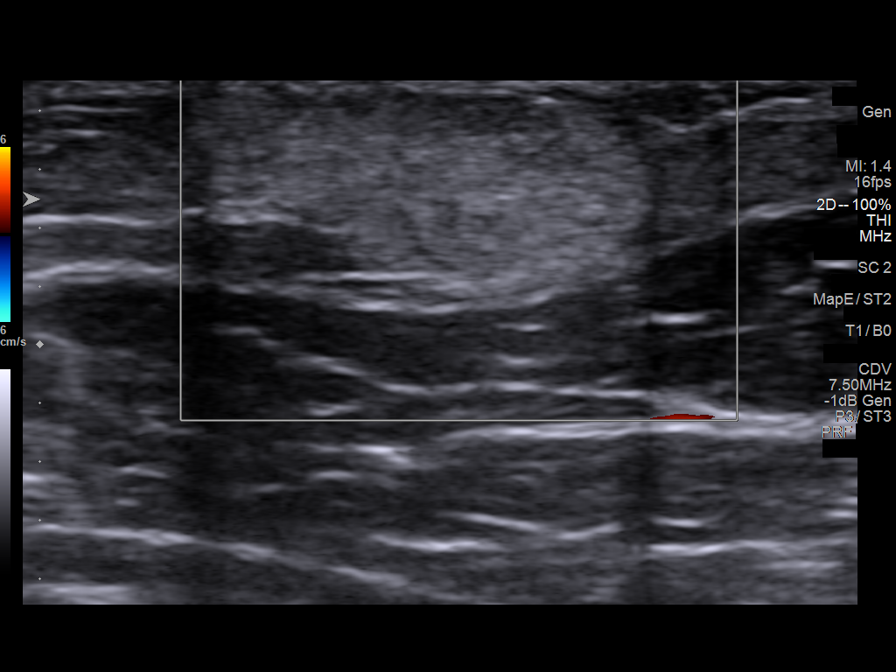
[im 4/8]
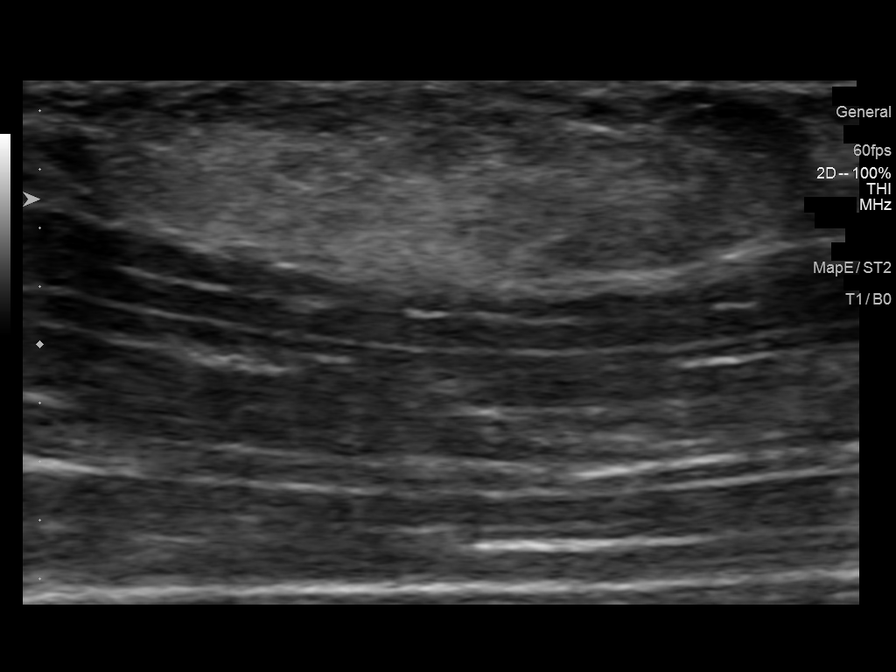
[im 5/8]
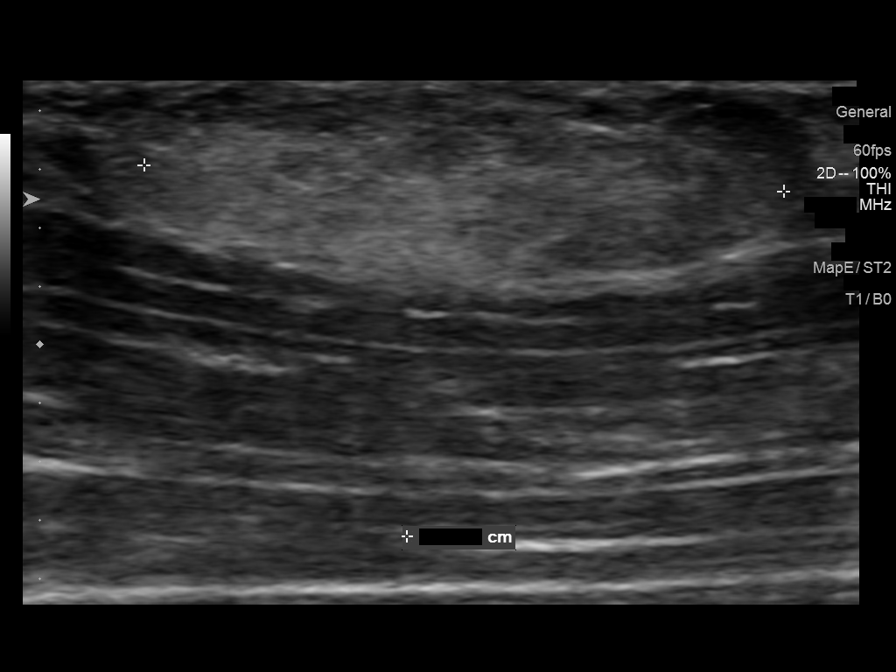
[im 6/8]
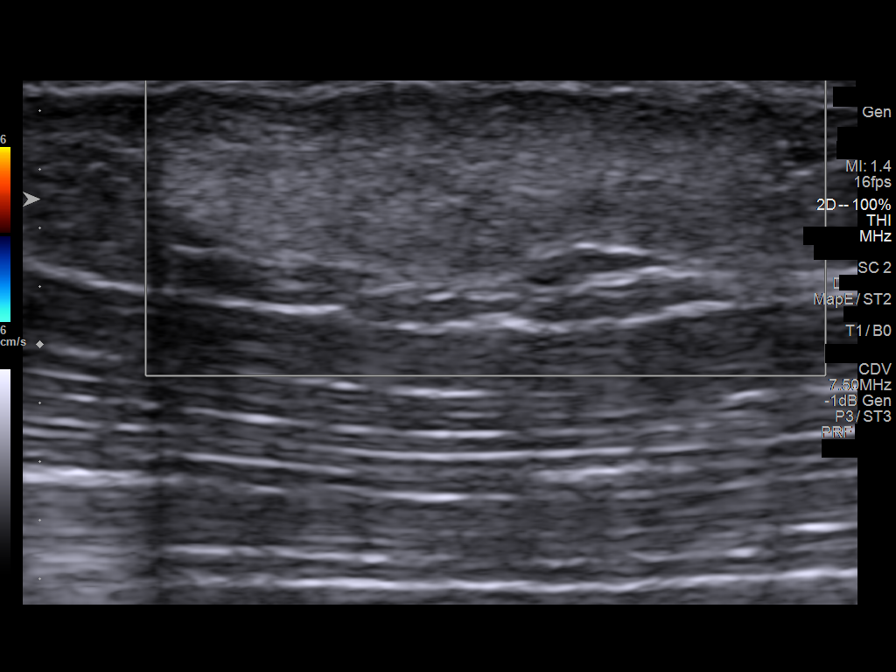
[im 7/8]
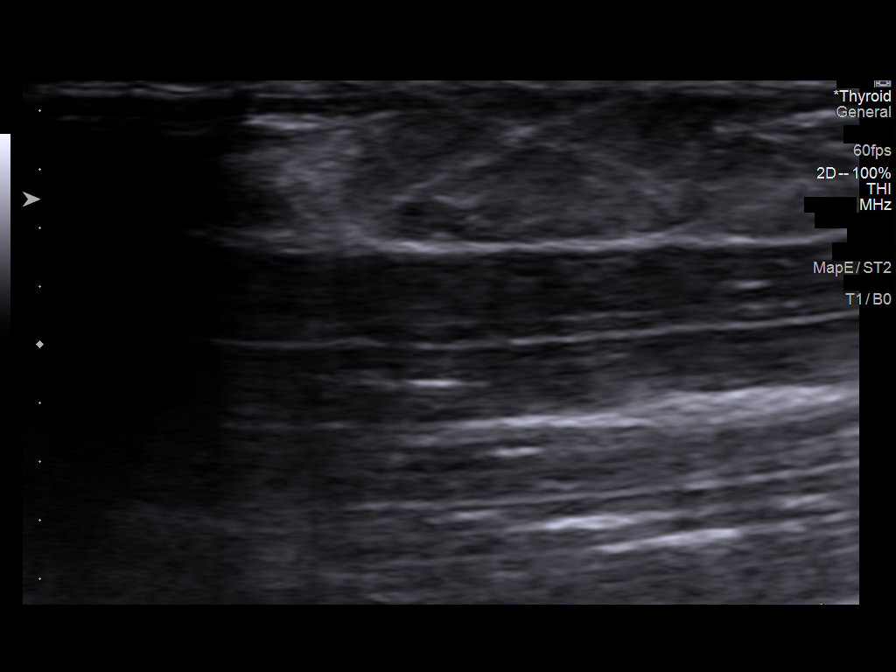
[im 8/8]
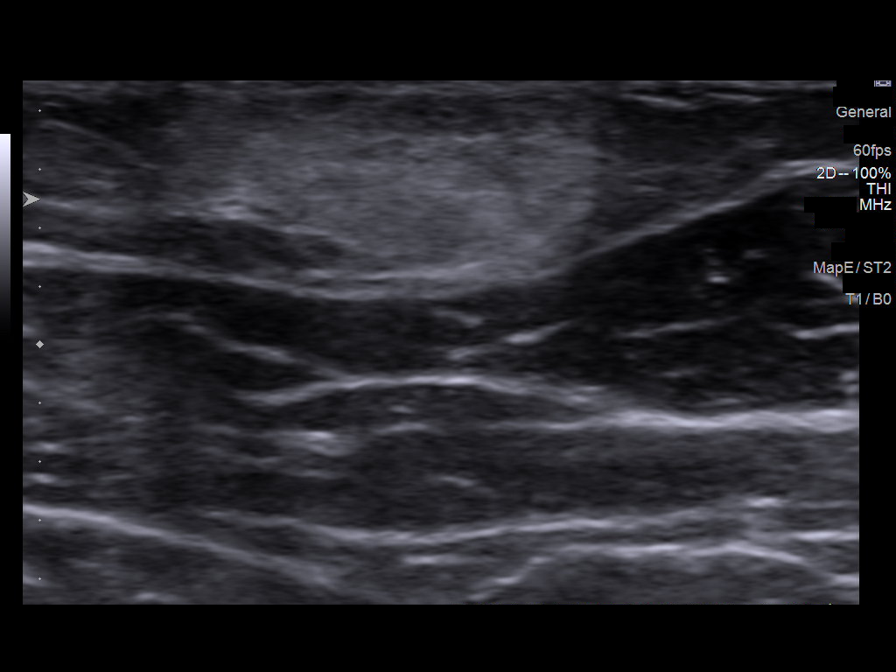

[8 of 8 positions shown; findings below may reference images not displayed]

FINDINGS: There is an ovoid, avascular structure in the subcutaneous soft
tissues of the forearm with echogenicity similar to subcutaneous
fat. There is no solid component or abnormal vascularity. There are
no thick septations. This structure measures approximately 1.1 x
x 2.2 cm.
IMPRESSION: Ovoid avascular fat echogenicity structure measuring approximately
1.1 x 0.6 x 2.2 cm, likely representing a lipoma. Follow-up
examination could be considered if it becomes symptomatic or there
is increased in size.

## 2024-03-06 DIAGNOSIS — J019 Acute sinusitis, unspecified: Secondary | ICD-10-CM | POA: Diagnosis not present

## 2024-03-13 DIAGNOSIS — B49 Unspecified mycosis: Secondary | ICD-10-CM | POA: Diagnosis not present

## 2024-03-13 DIAGNOSIS — J329 Chronic sinusitis, unspecified: Secondary | ICD-10-CM | POA: Diagnosis not present

## 2024-03-25 ENCOUNTER — Encounter (INDEPENDENT_AMBULATORY_CARE_PROVIDER_SITE_OTHER): Payer: Self-pay

## 2024-05-19 ENCOUNTER — Other Ambulatory Visit (HOSPITAL_BASED_OUTPATIENT_CLINIC_OR_DEPARTMENT_OTHER): Payer: Self-pay | Admitting: Physician Assistant

## 2024-05-19 DIAGNOSIS — E039 Hypothyroidism, unspecified: Secondary | ICD-10-CM | POA: Diagnosis not present

## 2024-05-19 DIAGNOSIS — E7800 Pure hypercholesterolemia, unspecified: Secondary | ICD-10-CM | POA: Diagnosis not present

## 2024-05-19 DIAGNOSIS — I2510 Atherosclerotic heart disease of native coronary artery without angina pectoris: Secondary | ICD-10-CM | POA: Diagnosis not present

## 2024-05-19 DIAGNOSIS — J0101 Acute recurrent maxillary sinusitis: Secondary | ICD-10-CM | POA: Diagnosis not present

## 2024-05-27 ENCOUNTER — Ambulatory Visit (HOSPITAL_BASED_OUTPATIENT_CLINIC_OR_DEPARTMENT_OTHER)
Admission: RE | Admit: 2024-05-27 | Discharge: 2024-05-27 | Disposition: A | Discharge status: Home / Self Care | Payer: Self-pay | Source: Ambulatory Visit | Attending: Physician Assistant | Admitting: Physician Assistant

## 2024-05-27 DIAGNOSIS — E7800 Pure hypercholesterolemia, unspecified: Secondary | ICD-10-CM | POA: Insufficient documentation
